# Patient Record
Sex: Female | Born: 2000 | Race: White | Hispanic: No | Marital: Single | Smoking: Never smoker
Health system: Southern US, Community
[De-identification: ages and names within clinical notes are randomized; demographics above are authoritative.]

---

## 2018-09-25 ENCOUNTER — Ambulatory Visit (INDEPENDENT_AMBULATORY_CARE_PROVIDER_SITE_OTHER): Payer: BLUE CROSS/BLUE SHIELD | Admitting: Family Medicine

## 2018-09-25 ENCOUNTER — Encounter: Payer: Self-pay | Admitting: Family Medicine

## 2018-09-25 DIAGNOSIS — M25562 Pain in left knee: Secondary | ICD-10-CM

## 2018-09-25 DIAGNOSIS — G8929 Other chronic pain: Secondary | ICD-10-CM

## 2018-09-25 NOTE — Progress Notes (Signed)
Office Visit Note   Patient: Colleen Rosales           Date of Birth: 09-01-2000           MRN: [id number] Visit Date: 09/25/2018 Requested by: No referring provider defined for this encounter. PCP: Patient, No Pcp Per  Subjective: Chief Complaint  Patient presents with  . Left Knee - Pain    H/o 2 surgeries on the knee, 2016 & 2017 for LMT and MMT. Has had clicking in the knee ever since. Pain lateral aspect of knee and down to ankle/foot.     HPI: She is here with left knee pain.  She has a history of soccer and then lacrosse related injuries to the left knee at age 18 and [age].  The first time she tore her ACL and lateral meniscus and underwent ACL repair with her patella tendon as well as lateral meniscus debridement.  The second injury resulted in recurrent ACL.  Medial meniscus tear, bucket-handle.  She underwent partial meniscectomy and ACL reconstruction with hamstring autograft but it was apparently too short so cadaver tendon was also used.  She recovered from her second surgery and was able to regain a physically active lifestyle.  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  She is working as a Systems analystpersonal trainer.  Over the past several months she has noticed pain and popping in her knee, usually when she fully flexes it and then actively extends it.  She had x-rays obtained by her chiropractor which were reportedly normal.  She has also been working with her physical therapist.  She gets temporary relief from treatments, but nothing long lasting.  Her symptoms seem to be mostly on the lateral aspect.              ROS: No fevers or chills.  All other systems were reviewed and are negative.  Objective: Vital Signs: There were no vitals taken for this visit.  Physical Exam:  General:  Alert and oriented, in no acute distress. Pulm:  Breathing unlabored. Psy:  Normal mood, congruent affect. Skin: No rash or erythema. Left knee: Well-healed  surgical scars, no joint effusion.  Full active extension and flexion of about 145 degrees.  Slight increased laxity with anterior drawer but still a good endpoint.  Negative patella apprehension test, negative patella compression test and no significant patella crepitation.  No palpable click with McMurray's.  Fibular head does not seem to sublux.  No significant tenderness around the common peroneal nerve.  Imaging: Limited diagnostic ultrasound left knee: No lateral meniscus tear seen.  IT band and hamstring tendons laterally look normal.  No obvious articular cartilage defect in the femoral trochlea.  Unable to re-create the popping with active knee extension and dynamic imaging.  Assessment & Plan: 1.  Chronic left knee pain with popping sensation, etiology uncertain articular cartilage injury.  She has been through extensive conservative management and still having symptoms. -We will order MRI to further evaluate.  No indication for surgery, could contemplate dextrose prolotherapy.     Procedures: No procedures performed  No notes on file     PMFS History: There are no active problems to display for this patient.  History reviewed. No pertinent past medical history.  History reviewed. No pertinent family history.  History reviewed. No pertinent surgical history. Social History   Occupational History  . Not on file  Tobacco Use  . Smoking status: Never  Smoker  . Smokeless tobacco: Never Used  Substance and Sexual Activity  . Alcohol use: Not Currently  . Drug use: Never    Frequency: 2.0 times per week  . Sexual activity: Not on file

## 2018-09-29 ENCOUNTER — Ambulatory Visit (HOSPITAL_BASED_OUTPATIENT_CLINIC_OR_DEPARTMENT_OTHER)
Admission: RE | Admit: 2018-09-29 | Discharge: 2018-09-29 | Disposition: A | Discharge status: Home / Self Care | Payer: BLUE CROSS/BLUE SHIELD | Source: Ambulatory Visit | Attending: Family Medicine | Admitting: Family Medicine

## 2018-09-29 ENCOUNTER — Other Ambulatory Visit: Payer: Self-pay

## 2018-09-29 DIAGNOSIS — M25562 Pain in left knee: Secondary | ICD-10-CM | POA: Insufficient documentation

## 2018-09-29 DIAGNOSIS — G8929 Other chronic pain: Secondary | ICD-10-CM | POA: Insufficient documentation

## 2018-10-01 ENCOUNTER — Encounter: Payer: Self-pay | Admitting: Family Medicine

## 2018-10-01 ENCOUNTER — Telehealth: Payer: Self-pay | Admitting: Family Medicine

## 2018-10-01 NOTE — Telephone Encounter (Signed)
MRI shows:  Edema on the back/medial side of the femur.  Not sure what is causing it.  Thickened medial plica.  Small partially ruptured Baker's cyst.  Tiny lateral meniscus tear.   Notified patient of results.  She's not interested in injections.  Would like to schedule appointment with SD to discuss options.

## 2018-10-03 ENCOUNTER — Encounter: Payer: Self-pay | Admitting: Orthopedic Surgery

## 2018-10-03 ENCOUNTER — Ambulatory Visit (INDEPENDENT_AMBULATORY_CARE_PROVIDER_SITE_OTHER): Payer: BLUE CROSS/BLUE SHIELD | Admitting: Orthopedic Surgery

## 2018-10-03 ENCOUNTER — Other Ambulatory Visit: Payer: Self-pay

## 2018-10-03 DIAGNOSIS — M25562 Pain in left knee: Secondary | ICD-10-CM | POA: Diagnosis not present

## 2018-10-03 DIAGNOSIS — G8929 Other chronic pain: Secondary | ICD-10-CM

## 2018-10-03 NOTE — Progress Notes (Signed)
Office Visit Note   Patient: Colleen Rosales           Date of Birth: [birth year]           MRN: [id number] Visit Date: 10/03/2018 Requested by: No referring provider defined for this encounter. PCP: Patient, No Pcp Per  Subjective: Chief Complaint  Patient presents with   Left Knee - Pain    HPI: Patient presents with left knee pain.  She has had ACL reconstruction with meniscal work done x2 in 2016 2017.  [place]  She is a Consulting civil engineerstudent at [employer].  She does do daily weight lifting.  She is reporting vague pain in the fibular region which radiates down the leg.  She works as a Systems analystpersonal trainer also.  Denies any back pain.  Does not do any sports.  Does not really report any symptomatic instability of that left knee.  She has an MRI scan which is reviewed.  I reviewed the scan with her and it shows intact graft with a little bit of small free edge irregularity of the lateral meniscus.  Medial meniscus intact with partial resection has been performed.              ROS: All systems reviewed are negative as they relate to the chief complaint within the history of present illness.  Patient denies  fevers or chills.   Assessment & Plan: Visit Diagnoses:  1. Chronic pain of left knee     Plan: Impression is left knee pain with no clear-cut operative pathology from the MRI scan and exam.  I think there is radiating pain she is having down her leg is likely related to some type of nerve compression.  The fibular head is stable in its relation to the proximal tibia.  Nerve study could be considered but unless she is having significant symptoms and weakness I do not think neuro lysis of the peroneal nerve is indicated.  I do not think this is coming from her back.  The intra-articular portion of the knee is also intact.  She wants to essentially have a pain-free workout with heavy resistance and squats and lunges.  I do still note that is possible after ACL reconstruction  and revision reconstruction.  I see nothing that is clear-cut operative that would "fix" her problem.  See her back as needed.  Follow-Up Instructions: Return if symptoms worsen or fail to improve.   Orders:  No orders of the defined types were placed in this encounter.  No orders of the defined types were placed in this encounter.     Procedures: No procedures performed   Clinical Data: No additional findings.  Objective: Vital Signs: There were no vitals taken for this visit.  Physical Exam:   Constitutional: Patient appears well-developed HEENT:  Head: Normocephalic Eyes:EOM are normal Neck: Normal range of motion Cardiovascular: Normal rate Pulmonary/chest: Effort normal Neurologic: Patient is alert Skin: Skin is warm Psychiatric: Patient has normal mood and affect    Ortho Exam: Ortho exam demonstrates full range of motion of the left knee with no effusion.  No real focal joint line tenderness is present.  Fibular head stable in relation to the proximal tibia.  Negative Tinel's peroneal nerve around the fibular head.  Ankle dorsiflexion 5 out of 5 on the left-hand side with palpable pedal pulses.  Graft is stable.  No posterior lateral rotatory instability is noted.  Muscle strength and girth is relatively symmetric.  Specialty Comments:  No specialty comments available.  Imaging: No results found.   PMFS History: There are no active problems to display for this patient.  History reviewed. No pertinent past medical history.  History reviewed. No pertinent family history.  History reviewed. No pertinent surgical history. Social History   Occupational History   Not on file  Tobacco Use   Smoking status: Never Smoker   Smokeless tobacco: Never Used  Substance and Sexual Activity   Alcohol use: Not Currently   Drug use: Never    Frequency: 2.0 times per week   Sexual activity: Not on file

## 2020-05-13 IMAGING — MR MRI OF THE LEFT KNEE WITHOUT CONTRAST
8 series · 40 of 40 positions shown · non-contrast
Comparison: None.

CLINICAL DATA: Lateral knee pain with swelling and crepitus. Prior
knee surgeries.

EXAM:
MRI OF THE LEFT KNEE WITHOUT CONTRAST
TECHNIQUE: Multiplanar, multisequence MR imaging of the knee was performed. No
intravenous contrast was administered.

[Series 4: T2 fat-sat · axial · 4.0mm · 0.62mm/px · z∈[-84,+40]mm · 7 of 26 slices shown (1 of 3)]
[im 1/26]
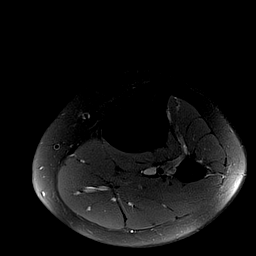
[im 5/26]
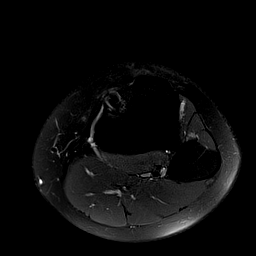
[im 9/26]
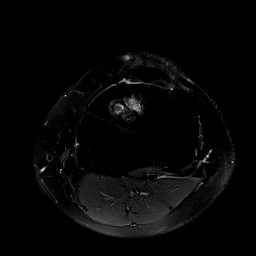
[im 13/26]
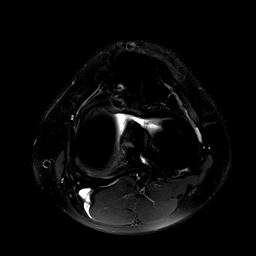
[im 17/26]
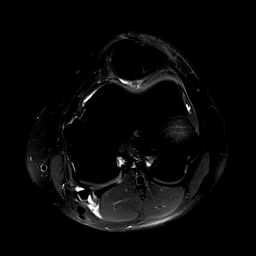
[im 21/26]
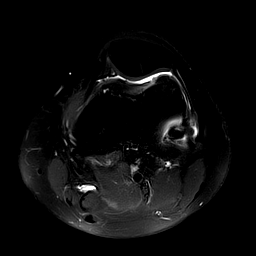
[im 26/26]
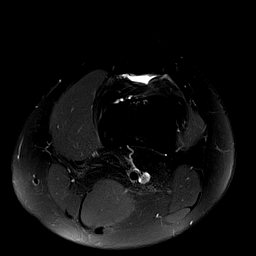

[Series 5: T1 · coronal · 4.0mm · 0.47mm/px · 5 of 25 slices shown]
[im 1/25]
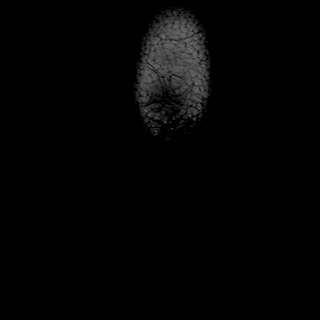
[im 7/25]
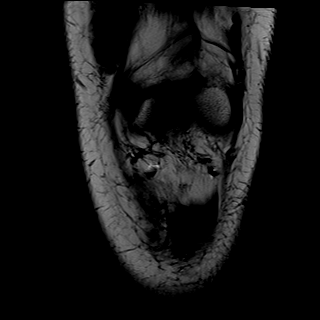
[im 13/25]
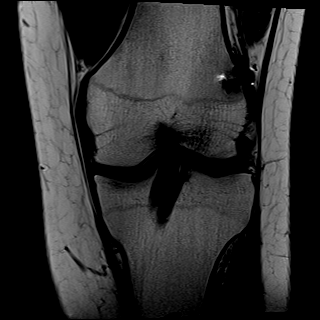
[im 19/25]
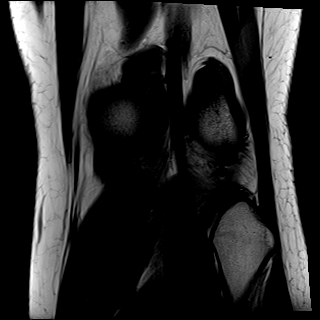
[im 25/25]
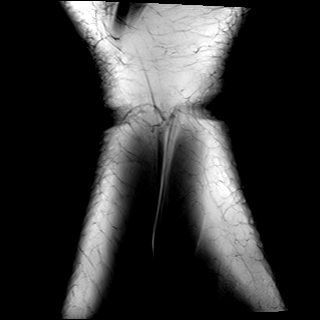

[Series 6: T2 fat-sat · coronal · 4.0mm · 0.59mm/px · 5 of 25 slices shown (2 of 3)]
[im 1/25]
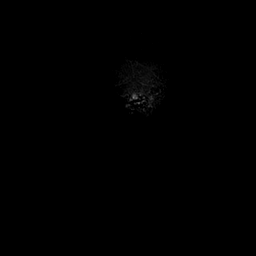
[im 7/25]
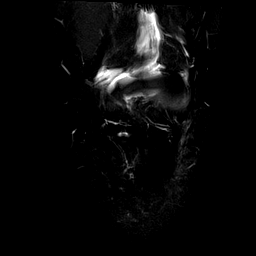
[im 13/25]
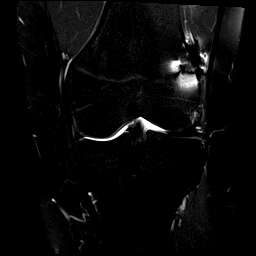
[im 19/25]
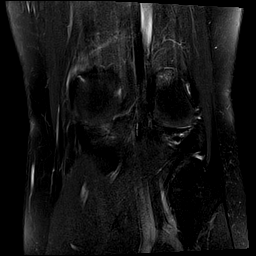
[im 25/25]
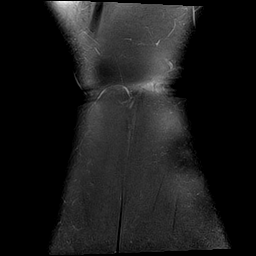

[Series 7: PD fat-sat · coronal · 3.0mm · 0.59mm/px · 6 of 30 slices shown (1 of 2)]
[im 1/30]
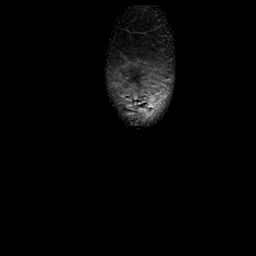
[im 6/30]
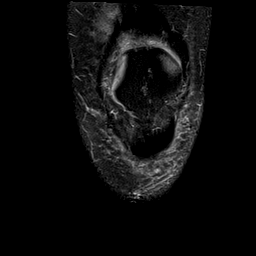
[im 12/30]
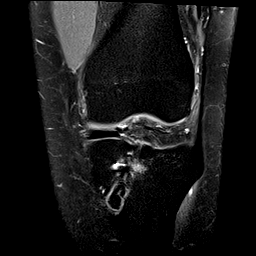
[im 18/30]
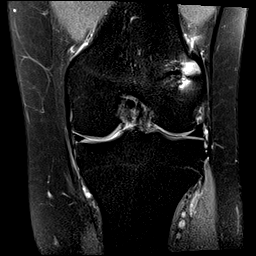
[im 24/30]
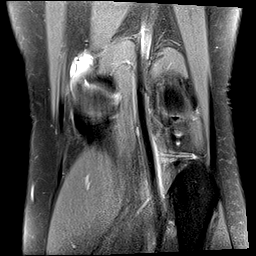
[im 30/30]
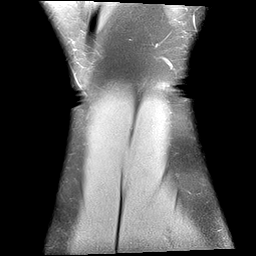

[Series 8: PD fat-sat · sagittal · 3.0mm · 0.59mm/px · 6 of 29 slices shown (2 of 2)]
[im 1/29]
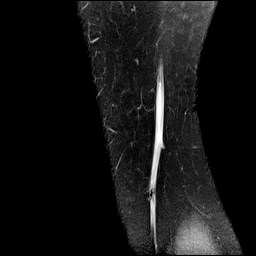
[im 6/29]
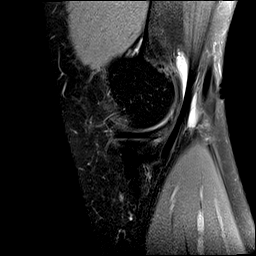
[im 12/29]
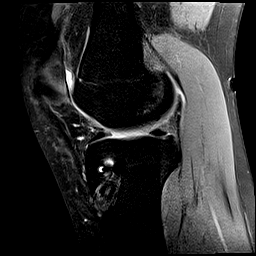
[im 17/29]
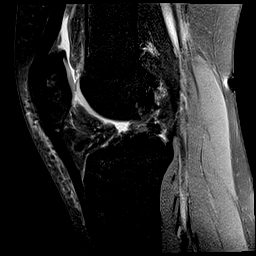
[im 23/29]
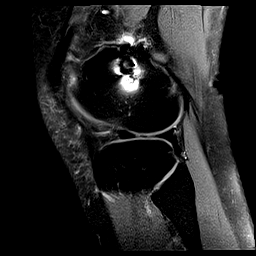
[im 29/29]
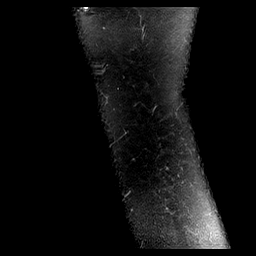

[Series 9: T2 fat-sat · sagittal · 3.0mm · 0.59mm/px · 6 of 29 slices shown (3 of 3)]
[im 1/29]
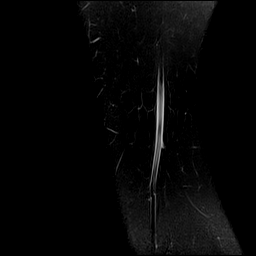
[im 6/29]
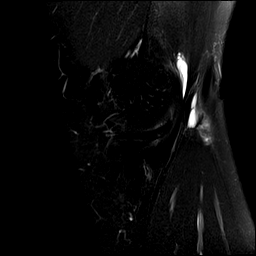
[im 12/29]
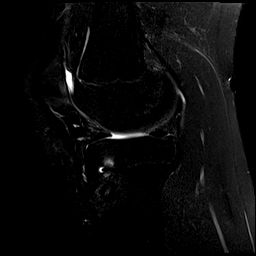
[im 17/29]
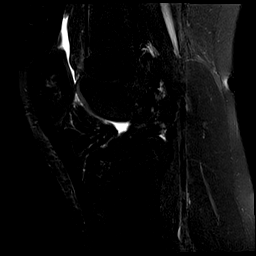
[im 23/29]
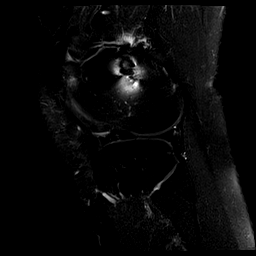
[im 29/29]
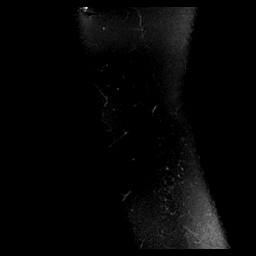

[Series 10: PD · coronal · 2.0mm · 0.59mm/px · 4 of 20 slices shown]
[im 1/20]
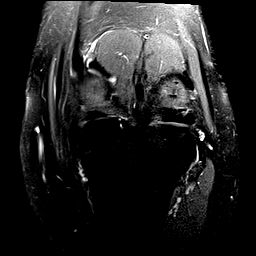
[im 7/20]
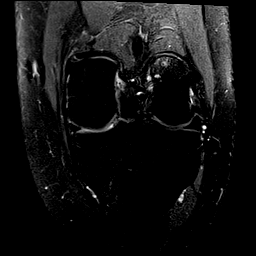
[im 13/20]
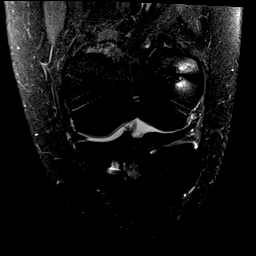
[im 20/20]
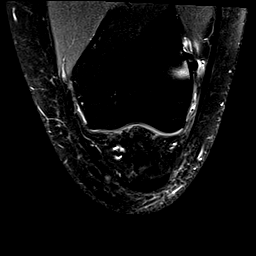

[Series 100: hx · axial · 8.0mm · 0.59mm/px · 1 of 3 slices shown]
[im 1/3]
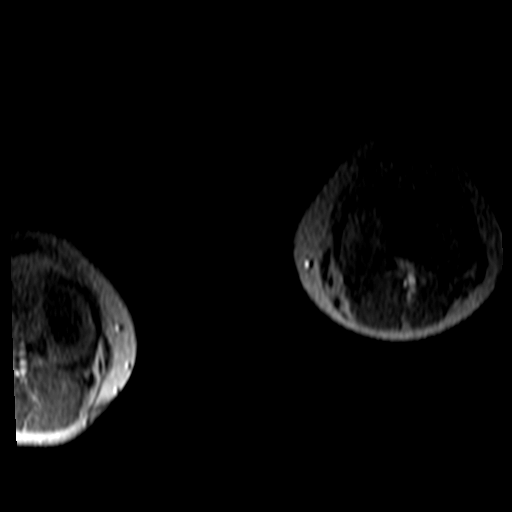

[40 of 40 positions shown; findings below may reference images not displayed]

FINDINGS: MENISCI

Medial meniscus: Somewhat diminutive, query prior partial
meniscectomy. No tear identified.

Lateral meniscus: Free edge fraying or minimal free edge tearing in
the midbody. Otherwise unremarkable.

LIGAMENTS

Cruciates: The ACL graft appears intact and without complicating
feature. PCL intact.

Collaterals:  Unremarkable

CARTILAGE

Patellofemoral:  Unremarkable

Medial:  Unremarkable

Lateral:  Unremarkable

Joint: Small knee effusion. Thickened medial plica. There is some
bandlike low T1 signal in Hoffa's fat pad for example on images 16
through 18 of series 5. No cyclops lesion. Tiny foci of
susceptibility artifact in the joint.

Popliteal Fossa:  Small partially ruptured Baker's cyst.

Extensor Mechanism:  Unremarkable

Bones: Subtle marrow edema is observed along the posteromedial
portion of the lateral femoral condyle just distal to the origin of
the lateral gastrocnemius muscle. This is shown on image [DATE].

Other: No supplemental non-categorized findings.
IMPRESSION: 1. Minimal free edge tearing of the midbody lateral meniscus.
2. The ACL graft appears intact.
3. Mild focal marrow edema in the posteromedial portion of the
lateral femoral condyle proximally, just distal to the origin of the
lateral gastrocnemius muscle. Cause uncertain.
4. Small knee effusion with thickened medial plica and mild fibrosis
in Hoffa's fat pad. No cyclops lesion.
5. Small partially ruptured Baker's cyst.
6. Tiny foci of susceptibility artifact in the joint, for example
posterolaterally, but without definite free osteochondral fragment.
